# Patient Record
Sex: Male | Born: 2000 | Race: Black or African American | Hispanic: No | Marital: Single | Smoking: Never smoker
Health system: Southern US, Community
[De-identification: ages and names within clinical notes are randomized; demographics above are authoritative.]

## PROBLEM LIST (undated history)

## (undated) ENCOUNTER — Emergency Department (HOSPITAL_BASED_OUTPATIENT_CLINIC_OR_DEPARTMENT_OTHER): Admission: EM | Payer: Self-pay | Source: Home / Self Care

---
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
## 2022-01-14 DIAGNOSIS — Z419 Encounter for procedure for purposes other than remedying health state, unspecified: Secondary | ICD-10-CM | POA: Diagnosis not present

## 2022-02-13 DIAGNOSIS — Z419 Encounter for procedure for purposes other than remedying health state, unspecified: Secondary | ICD-10-CM | POA: Diagnosis not present

## 2022-03-16 DIAGNOSIS — Z419 Encounter for procedure for purposes other than remedying health state, unspecified: Secondary | ICD-10-CM | POA: Diagnosis not present

## 2022-04-15 DIAGNOSIS — Z419 Encounter for procedure for purposes other than remedying health state, unspecified: Secondary | ICD-10-CM | POA: Diagnosis not present

## 2022-05-16 DIAGNOSIS — Z419 Encounter for procedure for purposes other than remedying health state, unspecified: Secondary | ICD-10-CM | POA: Diagnosis not present

## 2022-06-16 DIAGNOSIS — Z419 Encounter for procedure for purposes other than remedying health state, unspecified: Secondary | ICD-10-CM | POA: Diagnosis not present

## 2022-07-16 DIAGNOSIS — Z419 Encounter for procedure for purposes other than remedying health state, unspecified: Secondary | ICD-10-CM | POA: Diagnosis not present

## 2022-08-16 DIAGNOSIS — Z419 Encounter for procedure for purposes other than remedying health state, unspecified: Secondary | ICD-10-CM | POA: Diagnosis not present

## 2022-09-15 DIAGNOSIS — Z419 Encounter for procedure for purposes other than remedying health state, unspecified: Secondary | ICD-10-CM | POA: Diagnosis not present

## 2022-10-16 DIAGNOSIS — Z419 Encounter for procedure for purposes other than remedying health state, unspecified: Secondary | ICD-10-CM | POA: Diagnosis not present

## 2022-10-30 ENCOUNTER — Ambulatory Visit
Admission: EM | Admit: 2022-10-30 | Discharge: 2022-10-30 | Disposition: A | Discharge status: Home / Self Care | Payer: Medicaid Other | Attending: Urgent Care | Admitting: Urgent Care

## 2022-10-30 DIAGNOSIS — Z7251 High risk heterosexual behavior: Secondary | ICD-10-CM | POA: Insufficient documentation

## 2022-10-30 NOTE — ED Provider Notes (Signed)
  [facility]  Note:  This document was prepared using Systems analyst and may include unintentional dictation errors.  MRN: [id number] DOB: 09/18/2001  Subjective:   Jack Brewer is a 22 y.o. male presenting for STD screening.  Denies dysuria, hematuria, urinary frequency, penile discharge, penile swelling, testicular pain, testicular swelling, anal pain, groin pain. Has unprotected sex, has more than one male sex partner. No exposures.  No current facility-administered medications for this encounter.  Current Outpatient Medications:    ibuprofen (ADVIL) 800 MG tablet, Take 1 tablet (800 mg total) by mouth every 8 (eight) hours as needed., Disp: 30 tablet, Rfl: 0   No Known Allergies  History reviewed. No pertinent past medical history.   History reviewed. No pertinent surgical history.  Family History  Problem Relation Age of Onset   Healthy Mother    Healthy Father     Social History   Tobacco Use   Smoking status: Never   Smokeless tobacco: Never  Substance Use Topics   Alcohol use: No   Drug use: No    ROS   Objective:   Vitals: BP 107/70 (BP Location: Left Arm)   Pulse (!) 56   Temp 97.9 F (36.6 C) (Oral)   Resp 20   SpO2 96%   Physical Exam Constitutional:      General: He is not in acute distress.    Appearance: Normal appearance. He is well-developed and normal weight. He is not ill-appearing, toxic-appearing or diaphoretic.  HENT:     Head: Normocephalic and atraumatic.     Right Ear: External ear normal.     Left Ear: External ear normal.     Nose: Nose normal.     Mouth/Throat:     Pharynx: Oropharynx is clear.  Eyes:     General: No scleral icterus.       Right eye: No discharge.        Left eye: No discharge.     Extraocular Movements: Extraocular movements intact.  Cardiovascular:     Rate and Rhythm: Normal rate.  Pulmonary:     Effort: Pulmonary effort is normal.  Musculoskeletal:      Cervical back: Normal range of motion.  Neurological:     Mental Status: He is alert and oriented to person, place, and time.  Psychiatric:        Mood and Affect: Mood normal.        Behavior: Behavior normal.        Thought Content: Thought content normal.        Judgment: Judgment normal.    Assessment and Plan :   PDMP not reviewed this encounter.  1. Unprotected sex     Will base treatment off of results. Encouraged safe sex practices.    Jaynee Eagles, PA-C 10/30/22 1414

## 2022-10-30 NOTE — ED Triage Notes (Signed)
Pt reports he needs a std check as a routine. Denis std exposure

## 2022-10-31 ENCOUNTER — Telehealth (HOSPITAL_COMMUNITY): Payer: Self-pay | Admitting: Emergency Medicine

## 2022-10-31 LAB — CYTOLOGY, (ORAL, ANAL, URETHRAL) ANCILLARY ONLY
Chlamydia: POSITIVE — AB
Comment: NEGATIVE
Comment: NEGATIVE
Comment: NORMAL
Neisseria Gonorrhea: NEGATIVE
Trichomonas: NEGATIVE

## 2022-10-31 MED ORDER — DOXYCYCLINE HYCLATE 100 MG PO CAPS: 100.0000 mg | ORAL_CAPSULE | Freq: Two times a day (BID) | ORAL | 0 refills | Status: AC

## 2022-11-16 DIAGNOSIS — Z419 Encounter for procedure for purposes other than remedying health state, unspecified: Secondary | ICD-10-CM | POA: Diagnosis not present

## 2022-11-20 ENCOUNTER — Ambulatory Visit
Admission: EM | Admit: 2022-11-20 | Discharge: 2022-11-20 | Disposition: A | Discharge status: Home / Self Care | Payer: Medicaid Other | Attending: Nurse Practitioner | Admitting: Nurse Practitioner

## 2022-11-20 DIAGNOSIS — Z113 Encounter for screening for infections with a predominantly sexual mode of transmission: Secondary | ICD-10-CM

## 2022-11-20 NOTE — ED Triage Notes (Signed)
Pt states that he is being seen for std check up. Pt denies having any symptoms or exposure.

## 2022-11-20 NOTE — Discharge Instructions (Signed)
The clinical contact you with results of the testing are positive.

## 2022-11-20 NOTE — ED Provider Notes (Signed)
[facility]    CSN: [id number] Arrival date & time: 11/20/22  1343      History   Chief Complaint Chief Complaint  Patient presents with   std check    HPI Jack Brewer is a 22 y.o. male presents for retesting of STD.  Patient was seen in urgent care on 1/15 and tested positive for chlamydia.  He was asymptomatic at the time.  He was treated with doxycycline.  He states he vomited 2 doses of labs and then also missed 2 doses but then doubled up the next day.  He again has no current symptoms.  He states he was told to come back in 2 weeks for recheck.  No dysuria.  No other concerns at this time.  HPI  History reviewed. No pertinent past medical history.  There are no problems to display for this patient.   History reviewed. No pertinent surgical history.     Home Medications    Prior to Admission medications   Medication Sig Start Date End Date Taking? Authorizing Provider  ibuprofen (ADVIL) 800 MG tablet Take 1 tablet (800 mg total) by mouth every 8 (eight) hours as needed. 06/02/20   Zigmund Gottron, NP    Family History Family History  Problem Relation Age of Onset   Healthy Mother    Healthy Father     Social History Social History   Tobacco Use   Smoking status: Never   Smokeless tobacco: Never  Substance Use Topics   Alcohol use: No   Drug use: No     Allergies   Patient has no known allergies.   Review of Systems Review of Systems  Genitourinary:        STD screening     Physical Exam Triage Vital Signs ED Triage Vitals  Enc Vitals Group     BP 11/20/22 1402 109/68     Pulse Rate 11/20/22 1402 60     Resp 11/20/22 1402 20     Temp 11/20/22 1402 98 F (36.7 C)     Temp Source 11/20/22 1402 Oral     SpO2 11/20/22 1402 95 %     Weight 11/20/22 1400 150 lb (68 kg)     Height 11/20/22 1400 6\' 1"  (1.854 m)     Head Circumference --      Peak Flow --      Pain Score 11/20/22 1400 0     Pain Loc --      Pain Edu? --       Excl. in [place]? --    No data found.  Updated Vital Signs BP 109/68 (BP Location: Right Arm)   Pulse 60   Temp 98 F (36.7 C) (Oral)   Resp 20   Ht 6\' 1"  (1.854 m)   Wt 150 lb (68 kg)   SpO2 95%   BMI 19.79 kg/m   Visual Acuity Right Eye Distance:   Left Eye Distance:   Bilateral Distance:    Right Eye Near:   Left Eye Near:    Bilateral Near:     Physical Exam Vitals and nursing note reviewed.  Constitutional:      Appearance: Normal appearance.  Eyes:     Pupils: Pupils are equal, round, and reactive to light.  Cardiovascular:     Rate and Rhythm: Normal rate.  Pulmonary:     Effort: Pulmonary effort is normal.  Skin:    General: Skin is warm and dry.  Neurological:  General: No focal deficit present.     Mental Status: He is alert and oriented to person, place, and time.  Psychiatric:        Mood and Affect: Mood normal.        Behavior: Behavior normal.      UC Treatments / Results  Labs (all labs ordered are listed, but only abnormal results are displayed) Labs Reviewed  CYTOLOGY, (ORAL, ANAL, URETHRAL) ANCILLARY ONLY    EKG   Radiology No results found.  Procedures Procedures (including critical care time)  Medications Ordered in UC Medications - No data to display  Initial Impression / Assessment and Plan / UC Course  I have reviewed the triage vital signs and the nursing notes.  Pertinent labs & imaging results that were available during my care of the patient were reviewed by me and considered in my medical decision making (see chart for details).     Reviewed exam and symptoms with patient.  Discussed with patient current recommendations are 4 to 6 weeks for retesting.  Discussed that even if his test came back positive again as he was asymptomatic does not mean he would need to be retreated.  At that point he would likely need to have a retest at the 6-week mark.  He verbalized understanding of this but would still like to be tested  today. STD testing as ordered and will contact with results PCP follow-up as needed Final Clinical Impressions(s) / UC Diagnoses   Final diagnoses:  Screening examination for STD (sexually transmitted disease)     Discharge Instructions      The clinical contact you with results of the testing are positive.   ED Prescriptions   None    PDMP not reviewed this encounter.   Melynda Ripple, NP 11/20/22 1414

## 2022-11-21 LAB — CYTOLOGY, (ORAL, ANAL, URETHRAL) ANCILLARY ONLY
Chlamydia: NEGATIVE
Comment: NEGATIVE
Comment: NEGATIVE
Comment: NORMAL
Neisseria Gonorrhea: NEGATIVE
Trichomonas: NEGATIVE

## 2022-12-15 DIAGNOSIS — Z419 Encounter for procedure for purposes other than remedying health state, unspecified: Secondary | ICD-10-CM | POA: Diagnosis not present

## 2023-01-15 DIAGNOSIS — Z419 Encounter for procedure for purposes other than remedying health state, unspecified: Secondary | ICD-10-CM | POA: Diagnosis not present

## 2023-02-14 DIAGNOSIS — Z419 Encounter for procedure for purposes other than remedying health state, unspecified: Secondary | ICD-10-CM | POA: Diagnosis not present

## 2023-03-17 DIAGNOSIS — Z419 Encounter for procedure for purposes other than remedying health state, unspecified: Secondary | ICD-10-CM | POA: Diagnosis not present

## 2023-04-16 DIAGNOSIS — Z419 Encounter for procedure for purposes other than remedying health state, unspecified: Secondary | ICD-10-CM | POA: Diagnosis not present

## 2023-05-17 DIAGNOSIS — Z419 Encounter for procedure for purposes other than remedying health state, unspecified: Secondary | ICD-10-CM | POA: Diagnosis not present

## 2023-06-17 DIAGNOSIS — Z419 Encounter for procedure for purposes other than remedying health state, unspecified: Secondary | ICD-10-CM | POA: Diagnosis not present

## 2023-07-13 ENCOUNTER — Ambulatory Visit
Admission: EM | Admit: 2023-07-13 | Discharge: 2023-07-13 | Disposition: A | Discharge status: Home / Self Care | Payer: Medicaid Other | Attending: Internal Medicine | Admitting: Internal Medicine

## 2023-07-13 DIAGNOSIS — Z113 Encounter for screening for infections with a predominantly sexual mode of transmission: Secondary | ICD-10-CM

## 2023-07-13 NOTE — ED Provider Notes (Signed)
  [facility]  Note:  This document was prepared using Conservation officer, historic buildings and may include unintentional dictation errors.  MRN: [id number] DOB: Sep 04, 2001  Subjective:   Jack Brewer is a 22 y.o. male presenting for STI screen for gonorrhea, chlamydia, trichomonas. Denies dysuria, hematuria, urinary frequency, penile discharge, penile swelling, testicular pain, testicular swelling, anal pain, groin pain.  Patient has unprotected sex with 1 male partner. Does not believe it is a monogamous relationship.    No current facility-administered medications for this encounter.  Current Outpatient Medications:    ibuprofen (ADVIL) 800 MG tablet, Take 1 tablet (800 mg total) by mouth every 8 (eight) hours as needed., Disp: 30 tablet, Rfl: 0   No Known Allergies  History reviewed. No pertinent past medical history.   History reviewed. No pertinent surgical history.  Family History  Problem Relation Age of Onset   Healthy Mother    Healthy Father     Social History   Tobacco Use   Smoking status: Never   Smokeless tobacco: Never  Substance Use Topics   Alcohol use: No   Drug use: No    ROS   Objective:   Vitals: BP 119/74 (BP Location: Right Arm)   Pulse 63   Temp 98 F (36.7 C) (Oral)   Resp 16   SpO2 97%   Physical Exam Constitutional:      General: He is not in acute distress.    Appearance: Normal appearance. He is well-developed and normal weight. He is not ill-appearing, toxic-appearing or diaphoretic.  HENT:     Head: Normocephalic and atraumatic.     Right Ear: External ear normal.     Left Ear: External ear normal.     Nose: Nose normal.     Mouth/Throat:     Pharynx: Oropharynx is clear.  Eyes:     General: No scleral icterus.       Right eye: No discharge.        Left eye: No discharge.     Extraocular Movements: Extraocular movements intact.  Cardiovascular:     Rate and Rhythm: Normal rate.  Pulmonary:      Effort: Pulmonary effort is normal.  Musculoskeletal:     Cervical back: Normal range of motion.  Neurological:     Mental Status: He is alert and oriented to person, place, and time.  Psychiatric:        Mood and Affect: Mood normal.        Behavior: Behavior normal.        Thought Content: Thought content normal.        Judgment: Judgment normal.     Assessment and Plan :   PDMP not reviewed this encounter.  1. Screen for STD (sexually transmitted disease)    STI check pending, will treat as appropriate based off of results.  Patient politely refused HIV and syphilis testing.  Verbalized understanding that these are also sexual transmitted infections that we can test for.   Wallis Bamberg, [place] 07/13/23 1624

## 2023-07-13 NOTE — Discharge Instructions (Signed)
We will let you know about your test results Monday. If you test positive for anything we will contact you and send prescriptions to your pharmacy.

## 2023-07-13 NOTE — ED Triage Notes (Signed)
Patient presents to [facility] for STD screening. No known exposure. Denies any symptoms.

## 2023-07-16 ENCOUNTER — Telehealth: Payer: Self-pay | Admitting: Emergency Medicine

## 2023-07-16 LAB — CYTOLOGY, (ORAL, ANAL, URETHRAL) ANCILLARY ONLY
Chlamydia: POSITIVE — AB
Comment: NEGATIVE
Comment: NEGATIVE
Comment: NORMAL
Neisseria Gonorrhea: NEGATIVE
Trichomonas: NEGATIVE

## 2023-07-16 MED ORDER — DOXYCYCLINE HYCLATE 100 MG PO CAPS: 100.0000 mg | ORAL_CAPSULE | Freq: Two times a day (BID) | ORAL | 0 refills | Status: AC

## 2023-07-16 NOTE — Telephone Encounter (Signed)
Doxycycline for positive Chlamydia

## 2023-12-24 ENCOUNTER — Emergency Department (HOSPITAL_BASED_OUTPATIENT_CLINIC_OR_DEPARTMENT_OTHER)
Admission: EM | Admit: 2023-12-24 | Discharge: 2023-12-24 | Disposition: A | Discharge status: Home / Self Care | Payer: Self-pay | Attending: Diagnostic Radiology | Admitting: Diagnostic Radiology

## 2023-12-24 ENCOUNTER — Ambulatory Visit
Admission: EM | Admit: 2023-12-24 | Discharge: 2023-12-24 | Disposition: A | Discharge status: Home / Self Care | Payer: Self-pay | Attending: Family Medicine | Admitting: Family Medicine

## 2023-12-24 DIAGNOSIS — M79671 Pain in right foot: Secondary | ICD-10-CM

## 2023-12-24 DIAGNOSIS — S9031XA Contusion of right foot, initial encounter: Secondary | ICD-10-CM

## 2023-12-24 NOTE — ED Provider Notes (Signed)
  [facility]  Note:  This document was prepared using Conservation officer, historic buildings and may include unintentional dictation errors.  MRN: [id number] DOB: 13-Sep-2001  Subjective:   Jack Brewer is a 23 y.o. male presenting for suffering a right foot crush injury today.  Patient was wearing thick steel toe boots and a cement block was dropped right over his right foot.  Has since had pain, difficulty bearing weight.  He is able to walk but wants to make sure he does not have a fracture.  No current facility-administered medications for this encounter.  Current Outpatient Medications:    ibuprofen (ADVIL) 800 MG tablet, Take 1 tablet (800 mg total) by mouth every 8 (eight) hours as needed., Disp: 30 tablet, Rfl: 0   No Known Allergies  History reviewed. No pertinent past medical history.   History reviewed. No pertinent surgical history.  Family History  Problem Relation Age of Onset   Healthy Mother    Healthy Father     Social History   Tobacco Use   Smoking status: Never   Smokeless tobacco: Never  Substance Use Topics   Alcohol use: No   Drug use: No    ROS   Objective:   Vitals: BP 116/75 (BP Location: Right Arm)   Pulse 64   Temp 98.8 F (37.1 C) (Oral)   Resp 20   SpO2 97%   Physical Exam Constitutional:      General: He is not in acute distress.    Appearance: Normal appearance. He is well-developed and normal weight. He is not ill-appearing, toxic-appearing or diaphoretic.  HENT:     Head: Normocephalic and atraumatic.     Right Ear: External ear normal.     Left Ear: External ear normal.     Nose: Nose normal.     Mouth/Throat:     Pharynx: Oropharynx is clear.  Eyes:     General: No scleral icterus.       Right eye: No discharge.        Left eye: No discharge.     Extraocular Movements: Extraocular movements intact.  Cardiovascular:     Rate and Rhythm: Normal rate.  Pulmonary:     Effort: Pulmonary effort is  normal.  Musculoskeletal:     Cervical back: Normal range of motion.       Feet:  Neurological:     Mental Status: He is alert and oriented to person, place, and time.  Psychiatric:        Mood and Affect: Mood normal.        Behavior: Behavior normal.        Thought Content: Thought content normal.        Judgment: Judgment normal.    Patient fitted with a postop shoe for the right foot.  Assessment and Plan :   PDMP not reviewed this encounter.  1. Contusion of right foot, initial encounter   2. Right foot pain    Will pursue outpatient imaging, x-ray order placed. Recommended conservative management for right foot contusion.  Use RICE method, ibuprofen for pain and inflammation.  Wear the postop shoe.  Counseled patient on potential for adverse effects with medications prescribed/recommended today, ER and return-to-clinic precautions discussed, patient verbalized understanding.    Wallis Bamberg, [place] 12/24/23 1820

## 2023-12-24 NOTE — Discharge Instructions (Addendum)
 I have placed orders to have an x-ray done at the med center in [facility].  Please check in through the emergency room but do not register to be seen as a patient.  They will contact the radiology department and a staff member will take you to go get the x-ray done.   Wear the post-op shoe today and tomorrow. Use ibuprofen for pain and inflammation. Will update you with your x-ray results later today if not tomorrow when I get the report.

## 2023-12-24 NOTE — ED Triage Notes (Signed)
 Pt reports he dropped a large cement block on his right foot earlier today.

## 2023-12-25 ENCOUNTER — Encounter: Payer: Self-pay | Admitting: Urgent Care

## 2024-03-15 ENCOUNTER — Ambulatory Visit
Admission: EM | Admit: 2024-03-15 | Discharge: 2024-03-15 | Disposition: A | Discharge status: Home / Self Care | Payer: Self-pay | Attending: Family Medicine | Admitting: Family Medicine

## 2024-03-15 DIAGNOSIS — Z113 Encounter for screening for infections with a predominantly sexual mode of transmission: Secondary | ICD-10-CM | POA: Diagnosis present

## 2024-03-15 NOTE — Discharge Instructions (Signed)
 The clinic will contact you for any positive results of the testing done today.  Follow-up as needed.

## 2024-03-15 NOTE — ED Provider Notes (Signed)
 [facility]    CSN: [id number] Arrival date & time: 03/15/24  1525      History   Chief Complaint Chief Complaint  Patient presents with   std check    HPI Jack Brewer is a 23 y.o. male presents for STD screening.  Patient currently denies any symptoms including penile discharge, dysuria, testicular pain or swelling.  No known STD exposure.  No other concerns at this time.  HPI  History reviewed. No pertinent past medical history.  There are no active problems to display for this patient.   History reviewed. No pertinent surgical history.     Home Medications    Prior to Admission medications   Medication Sig Start Date End Date Taking? Authorizing Provider  ibuprofen  (ADVIL ) 800 MG tablet Take 1 tablet (800 mg total) by mouth every 8 (eight) hours as needed. 06/02/20   Bynum Cassis, NP    Family History Family History  Problem Relation Age of Onset   Healthy Mother    Healthy Father     Social History Social History   Tobacco Use   Smoking status: Never   Smokeless tobacco: Never  Vaping Use   Vaping status: Never Used  Substance Use Topics   Alcohol use: Yes   Drug use: No     Allergies   Patient has no known allergies.   Review of Systems Review of Systems  Genitourinary:        STD screening     Physical Exam Triage Vital Signs ED Triage Vitals [03/15/24 1552]  Encounter Vitals Group     BP      Systolic BP Percentile      Diastolic BP Percentile      Pulse      Resp      Temp      Temp src      SpO2      Weight 145 lb (65.8 kg)     Height 6\' 2"  (1.88 m)     Head Circumference      Peak Flow      Pain Score 0     Pain Loc      Pain Education      Exclude from Growth Chart    No data found.  Updated Vital Signs BP 100/69 (BP Location: Right Arm)   Pulse 82   Temp 98.7 F (37.1 C) (Oral)   Resp 19   Ht 6\' 2"  (1.88 m)   Wt 145 lb (65.8 kg)   SpO2 98%   BMI 18.62 kg/m   Visual Acuity Right Eye  Distance:   Left Eye Distance:   Bilateral Distance:    Right Eye Near:   Left Eye Near:    Bilateral Near:     Physical Exam Vitals and nursing note reviewed.  Constitutional:      Appearance: Normal appearance.  HENT:     Head: Normocephalic and atraumatic.  Eyes:     Pupils: Pupils are equal, round, and reactive to light.  Cardiovascular:     Rate and Rhythm: Normal rate.  Pulmonary:     Effort: Pulmonary effort is normal.  Skin:    General: Skin is warm and dry.  Neurological:     General: No focal deficit present.     Mental Status: He is alert and oriented to person, place, and time.  Psychiatric:        Mood and Affect: Mood normal.  Behavior: Behavior normal.      UC Treatments / Results  Labs (all labs ordered are listed, but only abnormal results are displayed) Labs Reviewed  CYTOLOGY, (ORAL, ANAL, URETHRAL) ANCILLARY ONLY    EKG   Radiology No results found.  Procedures Procedures (including critical care time)  Medications Ordered in UC Medications - No data to display  Initial Impression / Assessment and Plan / UC Course  I have reviewed the triage vital signs and the nursing notes.  Pertinent labs & imaging results that were available during my care of the patient were reviewed by me and considered in my medical decision making (see chart for details).     STD testing is ordered and will contact for any positive results.  Patient to follow-up as needed. Final Clinical Impressions(s) / UC Diagnoses   Final diagnoses:  Screening examination for STD (sexually transmitted disease)   Discharge Instructions   None    ED Prescriptions   None    PDMP not reviewed this encounter.   Alleen Arbour, NP 03/15/24 1556

## 2024-03-15 NOTE — ED Triage Notes (Signed)
 Pt states that he wants to be screened for std.  Pt denies any symptoms or contact.

## 2024-03-17 LAB — CYTOLOGY, (ORAL, ANAL, URETHRAL) ANCILLARY ONLY
Chlamydia: POSITIVE — AB
Comment: NEGATIVE
Comment: NEGATIVE
Comment: NORMAL
Neisseria Gonorrhea: NEGATIVE
Trichomonas: NEGATIVE

## 2024-03-18 ENCOUNTER — Ambulatory Visit (HOSPITAL_COMMUNITY): Payer: Self-pay

## 2024-03-18 MED ORDER — DOXYCYCLINE HYCLATE 100 MG PO TABS: 100.0000 mg | ORAL_TABLET | Freq: Two times a day (BID) | ORAL | 0 refills | Status: AC
